# Patient Record
Sex: Male | Born: 1965 | Race: White | Hispanic: No | Marital: Married | Smoking: Never smoker
Health system: Southern US, Community
[De-identification: ages and names within clinical notes are randomized; demographics above are authoritative.]

## PROBLEM LIST (undated history)

## (undated) DIAGNOSIS — J45990 Exercise induced bronchospasm: Secondary | ICD-10-CM

## (undated) DIAGNOSIS — I10 Essential (primary) hypertension: Secondary | ICD-10-CM

## (undated) DIAGNOSIS — K219 Gastro-esophageal reflux disease without esophagitis: Secondary | ICD-10-CM

## (undated) HISTORY — DX: Exercise induced bronchospasm: J45.990

## (undated) HISTORY — DX: Essential (primary) hypertension: I10

## (undated) HISTORY — DX: Gastro-esophageal reflux disease without esophagitis: K21.9

---

## 2000-03-29 HISTORY — PX: VASECTOMY: SHX75

## 2001-01-18 ENCOUNTER — Encounter: Payer: Self-pay | Admitting: *Deleted

## 2001-01-18 ENCOUNTER — Encounter: Admission: RE | Admit: 2001-01-18 | Discharge: 2001-01-18 | Payer: Self-pay | Admitting: *Deleted

## 2008-10-02 ENCOUNTER — Emergency Department (HOSPITAL_COMMUNITY): Admission: EM | Admit: 2008-10-02 | Discharge: 2008-10-02 | Payer: Self-pay | Admitting: Emergency Medicine

## 2008-10-17 ENCOUNTER — Ambulatory Visit (HOSPITAL_COMMUNITY): Admission: RE | Admit: 2008-10-17 | Discharge: 2008-10-17 | Payer: Self-pay | Admitting: Urology

## 2010-02-11 IMAGING — CR DG ABDOMEN 1V
1 series · 1 of 1 positions shown · non-contrast
Comparison: CT scan 10/02/2008

CLINICAL DATA: Pre lithotripsy for left ureteral calculus

ABDOMEN - 1 VIEW

[t abdomen supine]
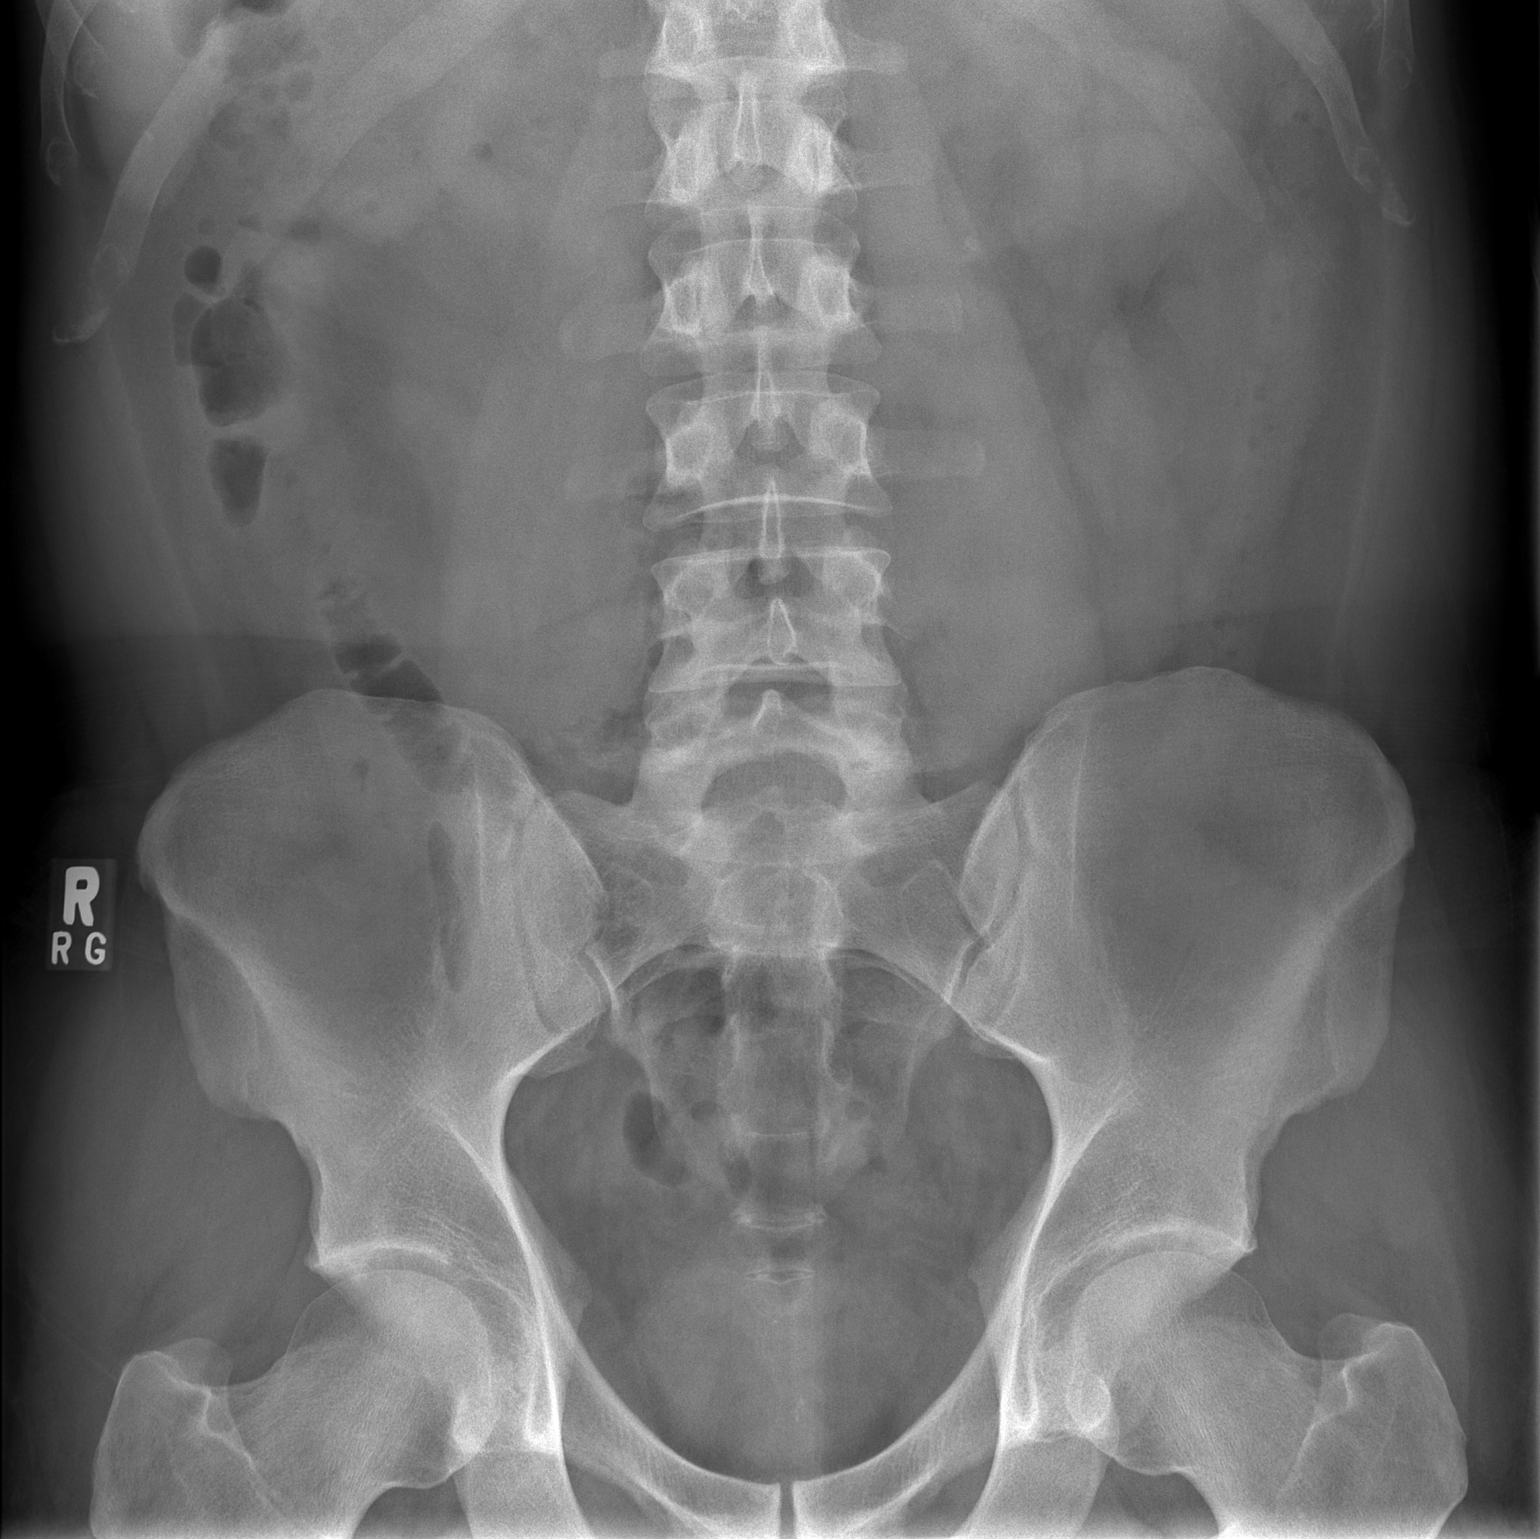

[1 of 1 positions shown; findings below may reference images not displayed]

FINDINGS: There is a 4 mm calcification on the left, between the
first and second transverse process sees.  This correlates with the
recent CT.  No other definite renal or ureteral calculi.
IMPRESSION: 4 mm calculus in the left proximal ureter.

## 2010-07-05 LAB — DIFFERENTIAL
Basophils Absolute: 0 10*3/uL (ref 0.0–0.1)
Eosinophils Absolute: 0.1 10*3/uL (ref 0.0–0.7)
Eosinophils Relative: 1 % (ref 0–5)
Lymphocytes Relative: 18 % (ref 12–46)

## 2010-07-05 LAB — CBC
HCT: 43.3 % (ref 39.0–52.0)
Platelets: 251 10*3/uL (ref 150–400)
RDW: 13.5 % (ref 11.5–15.5)

## 2010-07-05 LAB — URINE MICROSCOPIC-ADD ON

## 2010-07-05 LAB — URINALYSIS, ROUTINE W REFLEX MICROSCOPIC
Glucose, UA: NEGATIVE mg/dL
Leukocytes, UA: NEGATIVE
Nitrite: NEGATIVE
Protein, ur: 30 mg/dL — AB
Specific Gravity, Urine: 1.027 (ref 1.005–1.030)
Urobilinogen, UA: 0.2 mg/dL (ref 0.0–1.0)
pH: 5.5 (ref 5.0–8.0)

## 2010-07-05 LAB — BASIC METABOLIC PANEL
BUN: 18 mg/dL (ref 6–23)
Creatinine, Ser: 1.24 mg/dL (ref 0.4–1.5)
GFR calc non Af Amer: >60 mL/min (ref >60)
Glucose, Bld: 104 mg/dL — ABNORMAL HIGH (ref 70–99)
Potassium: 3.8 mEq/L (ref 3.5–5.1)

## 2018-03-29 HISTORY — PX: COLON RESECTION: SHX5231

## 2021-02-27 ENCOUNTER — Other Ambulatory Visit: Payer: Self-pay

## 2021-02-27 ENCOUNTER — Emergency Department (HOSPITAL_BASED_OUTPATIENT_CLINIC_OR_DEPARTMENT_OTHER): Payer: 59 | Admitting: Radiology

## 2021-02-27 ENCOUNTER — Emergency Department (HOSPITAL_BASED_OUTPATIENT_CLINIC_OR_DEPARTMENT_OTHER)
Admission: EM | Admit: 2021-02-27 | Discharge: 2021-02-27 | Disposition: A | Discharge status: Home / Self Care | Payer: 59 | Attending: Emergency Medicine | Admitting: Emergency Medicine

## 2021-02-27 ENCOUNTER — Encounter (HOSPITAL_BASED_OUTPATIENT_CLINIC_OR_DEPARTMENT_OTHER): Payer: Self-pay | Admitting: Emergency Medicine

## 2021-02-27 DIAGNOSIS — D72829 Elevated white blood cell count, unspecified: Secondary | ICD-10-CM | POA: Insufficient documentation

## 2021-02-27 DIAGNOSIS — R002 Palpitations: Secondary | ICD-10-CM | POA: Diagnosis not present

## 2021-02-27 DIAGNOSIS — R0602 Shortness of breath: Secondary | ICD-10-CM | POA: Diagnosis not present

## 2021-02-27 DIAGNOSIS — Z91010 Allergy to peanuts: Secondary | ICD-10-CM | POA: Insufficient documentation

## 2021-02-27 DIAGNOSIS — R079 Chest pain, unspecified: Secondary | ICD-10-CM | POA: Insufficient documentation

## 2021-02-27 DIAGNOSIS — R000 Tachycardia, unspecified: Secondary | ICD-10-CM | POA: Diagnosis not present

## 2021-02-27 LAB — BASIC METABOLIC PANEL
Anion gap: 10 (ref 5–15)
BUN: 17 mg/dL (ref 6–20)
CO2: 28 mmol/L (ref 22–32)
Calcium: 10 mg/dL (ref 8.9–10.3)
Chloride: 101 mmol/L (ref 98–111)
Creatinine, Ser: 1.13 mg/dL (ref 0.61–1.24)
GFR, Estimated: >60 mL/min (ref >60)
Glucose, Bld: 105 mg/dL — ABNORMAL HIGH (ref 70–99)
Potassium: 3.6 mmol/L (ref 3.5–5.1)
Sodium: 139 mmol/L (ref 135–145)

## 2021-02-27 LAB — CBC
HCT: 44.2 % (ref 39.0–52.0)
Hemoglobin: 15 g/dL (ref 13.0–17.0)
MCH: 29.9 pg (ref 26.0–34.0)
MCHC: 33.9 g/dL (ref 30.0–36.0)
MCV: 88.2 fL (ref 80.0–100.0)
Platelets: 295 10*3/uL (ref 150–400)
RBC: 5.01 MIL/uL (ref 4.22–5.81)
RDW: 12.6 % (ref 11.5–15.5)
WBC: 10.8 10*3/uL — ABNORMAL HIGH (ref 4.0–10.5)
nRBC: 0 % (ref 0.0–0.2)

## 2021-02-27 LAB — HEPATIC FUNCTION PANEL
ALT: 16 U/L (ref 0–44)
AST: 17 U/L (ref 15–41)
Albumin: 4.6 g/dL (ref 3.5–5.0)
Alkaline Phosphatase: 52 U/L (ref 38–126)
Bilirubin, Direct: 0.1 mg/dL (ref 0.0–0.2)
Indirect Bilirubin: 0.4 mg/dL (ref 0.3–0.9)
Total Bilirubin: 0.5 mg/dL (ref 0.3–1.2)
Total Protein: 7.5 g/dL (ref 6.5–8.1)

## 2021-02-27 LAB — D-DIMER, QUANTITATIVE: D-Dimer, Quant: <0.27 ug/mL-FEU (ref 0.00–0.50)

## 2021-02-27 LAB — LIPASE, BLOOD: Lipase: 21 U/L (ref 11–51)

## 2021-02-27 LAB — TROPONIN I (HIGH SENSITIVITY)
Troponin I (High Sensitivity): 3 ng/L (ref <18)
Troponin I (High Sensitivity): 4 ng/L (ref <18)

## 2021-02-27 NOTE — ED Triage Notes (Addendum)
Pt presents to ED POV. Pt c/o CP that began around 0200 this morning. Pt reports that pain began at rest, was nonradiatingk, described as sharp, and pt reports being was pale. Pt reports that pain was only for a brief time. swelling in LE was worse yesterday.

## 2021-02-27 NOTE — ED Provider Notes (Signed)
[facility] EMERGENCY DEPT Provider Note   CSN: [id number] Arrival date & time: 02/27/21  1516     History Chief Complaint  Patient presents with   Chest Pain    Mario Freeman is a 55 y.o. male. Patient presents to the emergency department because he woke up in the middle the night with sharp stabbing pain in the center of his test.  He has denies any radiation.  Pain went away on its own for about 20 minutes.  He went back to sleep and woke up at about 7 6 AM and fell off.  He says that he looked in the mirror and felt that he was going gray and was very pale.  He reports that this went away on its own as well.  Also reports yesterday having lower extremity swelling that was worse than normal.  Not specify whether one leg was more swollen than the other.  This is gotten better today.  He reports a lot of anxiety today and palpitations.  He also states that he had some shortness of breath today as well.   Chest Pain Associated symptoms: palpitations and shortness of breath   Associated symptoms: no abdominal pain, no back pain, no cough, no dizziness, no fever, no headache, no nausea, no vomiting and no weakness       History reviewed. No pertinent past medical history.  There are no problems to display for this patient.   History reviewed. No pertinent surgical history.     History reviewed. No pertinent family history.     Home Medications Prior to Admission medications   Not on File    Allergies    Amlodipine, Erythromycin, Lactose intolerance (gi), Lisinopril, Gluten meal, and Peanut oil  Review of Systems   Review of Systems  Constitutional:  Negative for chills and fever.  HENT:  Negative for congestion, rhinorrhea and sore throat.   Eyes:  Negative for visual disturbance.  Respiratory:  Positive for shortness of breath. Negative for cough and chest tightness.   Cardiovascular:  Positive for chest pain, palpitations and leg swelling.   Gastrointestinal:  Negative for abdominal pain, blood in stool, constipation, diarrhea, nausea and vomiting.  Genitourinary:  Negative for dysuria, flank pain and hematuria.  Musculoskeletal:  Negative for back pain.  Skin:  Negative for rash and wound.  Neurological:  Negative for dizziness, syncope, weakness, light-headedness and headaches.  Psychiatric/Behavioral:  Negative for confusion.   All other systems reviewed and are negative.  Physical Exam Updated Vital Signs BP (!) 151/90 (BP Location: Right Arm)   Pulse 97   Temp 98.3 F (36.8 C) (Oral)   Resp 16   Ht 6\' 1"  (1.854 m)   Wt 103 kg   SpO2 99%   BMI 29.95 kg/m   Physical Exam Vitals and nursing note reviewed.  Constitutional:      General: He is not in acute distress.    Appearance: Normal appearance. He is well-developed. He is not ill-appearing, toxic-appearing or diaphoretic.  HENT:     Head: Normocephalic and atraumatic.     Nose: No nasal deformity.     Mouth/Throat:     Lips: Pink. No lesions.     Mouth: No injury, lacerations, oral lesions or angioedema.     Pharynx: Uvula midline. No pharyngeal swelling or uvula swelling.  Eyes:     General: Gaze aligned appropriately. No scleral icterus.       Right eye: No discharge.  Left eye: No discharge.     Conjunctiva/sclera: Conjunctivae normal.     Right eye: Right conjunctiva is not injected. No exudate or hemorrhage.    Left eye: Left conjunctiva is not injected. No exudate or hemorrhage. Cardiovascular:     Rate and Rhythm: Regular rhythm. Tachycardia present.     Pulses: Normal pulses.          Radial pulses are 2+ on the right side and 2+ on the left side.       Dorsalis pedis pulses are 2+ on the right side and 2+ on the left side.     Heart sounds: Normal heart sounds, S1 normal and S2 normal. Heart sounds not distant. No murmur heard.   No friction rub. No gallop. No S3 or S4 sounds.  Pulmonary:     Effort: Pulmonary effort is normal. No  tachypnea, accessory muscle usage or respiratory distress.     Breath sounds: Normal breath sounds. No stridor. No decreased breath sounds, wheezing, rhonchi or rales.  Chest:     Chest wall: No tenderness.  Abdominal:     General: Abdomen is flat. Bowel sounds are normal. There is no distension.     Palpations: Abdomen is soft. There is no mass or pulsatile mass.     Tenderness: There is no abdominal tenderness. There is no guarding or rebound.  Musculoskeletal:     Right lower leg: No edema.     Left lower leg: No edema.  Skin:    General: Skin is warm and dry.     Coloration: Skin is not jaundiced or pale.     Findings: No bruising, erythema, lesion or rash.  Neurological:     General: No focal deficit present.     Mental Status: He is alert and oriented to person, place, and time.     GCS: GCS eye subscore is 4. GCS verbal subscore is 5. GCS motor subscore is 6.     Sensory: No sensory deficit.     Motor: No weakness.  Psychiatric:        Mood and Affect: Mood is anxious.        Behavior: Behavior normal. Behavior is cooperative.    ED Results / Procedures / Treatments   Labs (all labs ordered are listed, but only abnormal results are displayed) Labs Reviewed  BASIC METABOLIC PANEL - Abnormal; Notable for the following components:      Result Value   Glucose, Bld 105 (*)    All other components within normal limits  CBC - Abnormal; Notable for the following components:   WBC 10.8 (*)    All other components within normal limits  HEPATIC FUNCTION PANEL  LIPASE, BLOOD  D-DIMER, QUANTITATIVE  TROPONIN I (HIGH SENSITIVITY)  TROPONIN I (HIGH SENSITIVITY)    EKG None  Radiology DG Chest 2 View  Result Date: 02/27/2021 CLINICAL DATA:  Chest pain EXAM: CHEST - 2 VIEW COMPARISON:  None. FINDINGS: The heart size and mediastinal contours are within normal limits. Both lungs are clear. The visualized skeletal structures are unremarkable. IMPRESSION: No active cardiopulmonary  disease. Electronically Signed   By: Judie PetitM.  Shick M.D.   On: 02/27/2021 16:11    Procedures Procedures   Medications Ordered in ED Medications - No data to display  ED Course  I have reviewed the triage vital signs and the nursing notes.  Pertinent labs & imaging results that were available during my care of the patient were reviewed by me and considered  in my medical decision making (see chart for details).    MDM Rules/Calculators/A&P                         This is a well-appearing 55 year old male who presented with an episode of chest pain last night along with some feeling off and anxious feelings today. His vitals are stable.  He is afebrile. Work-up is normal with negative troponins.  He has a trace leukocytosis of 10.8.  BMP, lipase, hepatic function panel, dimer are all normal.  This is a patient with no cardiac history and story is not consistent with cardiac etiology of symptoms.  Asymptomatic at the time that I am seeing him.  I doubt that this was cardiac etiology.  Likely GI etiology given history of GERD.  He has an upcoming cardiology appointment in January.  He can keep this appointment to follow-up with them.  Reassurance provided.   Final Clinical Impression(s) / ED Diagnoses Final diagnoses:  Chest pain, unspecified type    Rx / DC Orders ED Discharge Orders     None        Adolphus Birchwood, PA-C 02/27/21 2224    Regan Lemming, MD 02/28/21 1932

## 2021-02-27 NOTE — Discharge Instructions (Signed)
Please follow up with your PCP. Please return to the ED if you develop worsening or more concerning symptoms.

## 2021-02-27 NOTE — ED Notes (Signed)
Patient states woke up with sharp stabbing in center of chest.  States woke with anxiety and then pain when away within 20 minutes.  Denies SOB or nausea.   No pain at present but states feels anxious all day.

## 2022-06-24 IMAGING — DX DG CHEST 2V
2 series · 2 of 2 positions shown · non-contrast
Comparison: None.

CLINICAL DATA: Chest pain

EXAM:
CHEST - 2 VIEW

[chest pa]
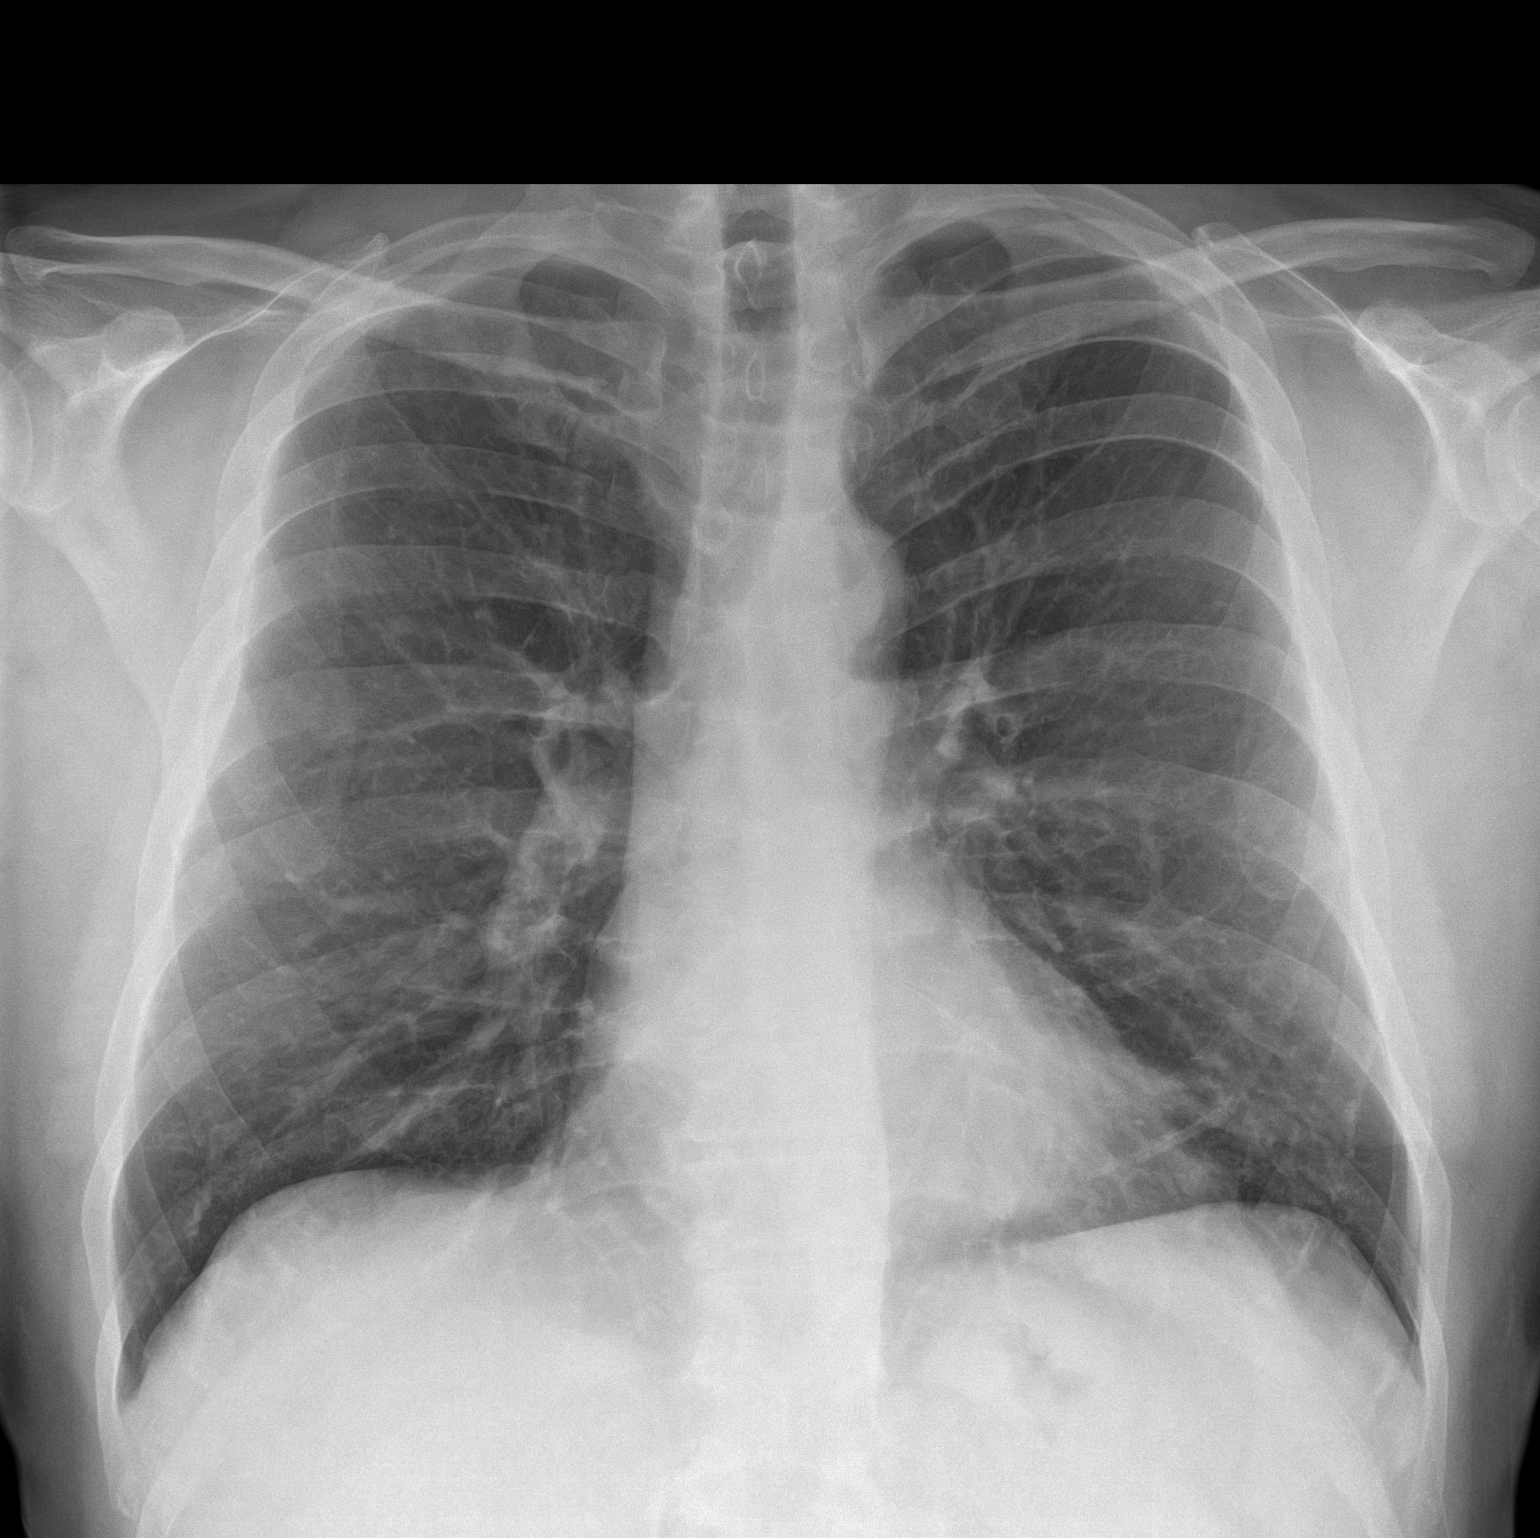

[chest lat]
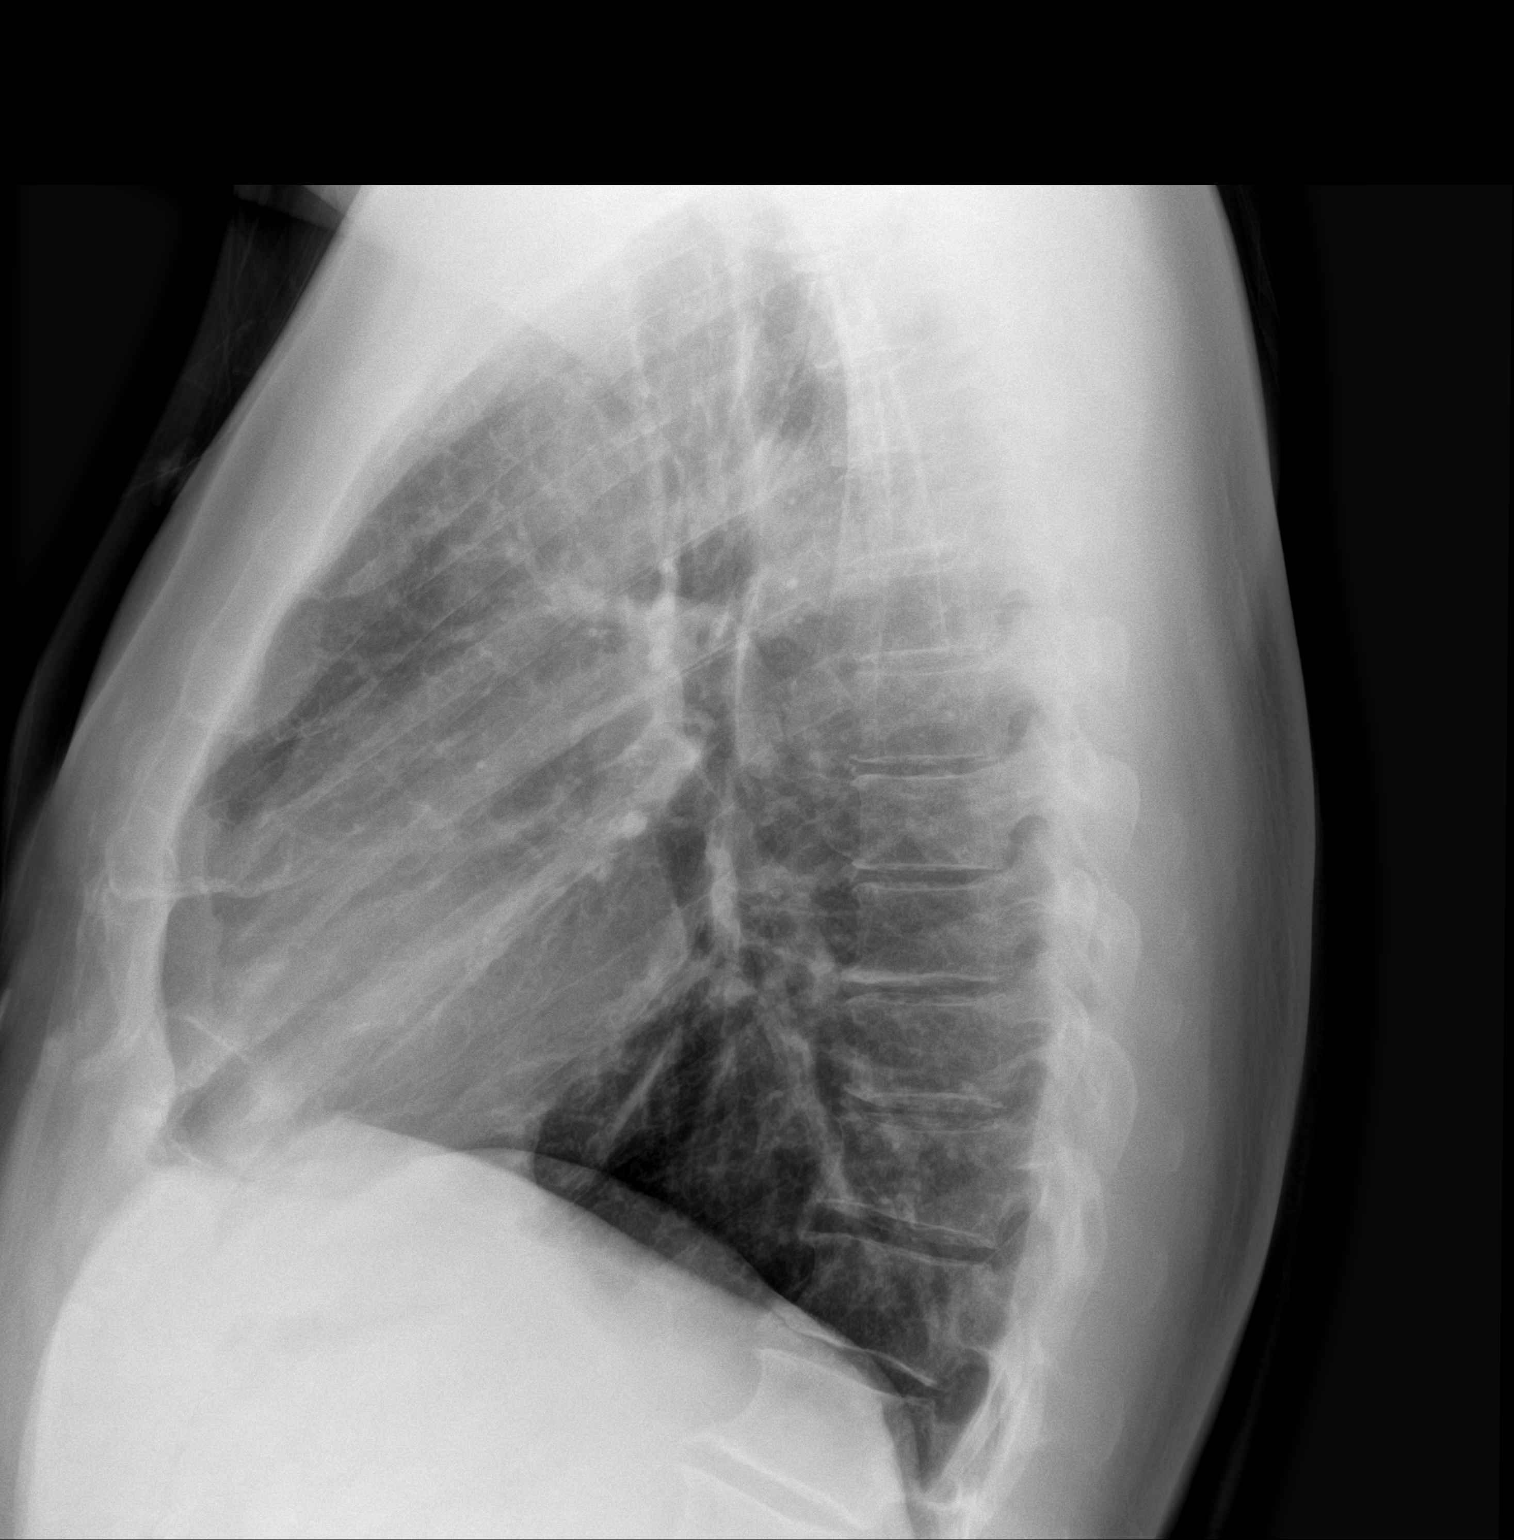

[2 of 2 positions shown; findings below may reference images not displayed]

FINDINGS: The heart size and mediastinal contours are within normal limits.
Both lungs are clear. The visualized skeletal structures are
unremarkable.
IMPRESSION: No active cardiopulmonary disease.

## 2022-11-18 ENCOUNTER — Ambulatory Visit (INDEPENDENT_AMBULATORY_CARE_PROVIDER_SITE_OTHER): Payer: 59 | Admitting: Family Medicine

## 2022-11-18 ENCOUNTER — Encounter: Payer: Self-pay | Admitting: Family Medicine

## 2022-11-18 VITALS — BP 132/98 | HR 103 | Temp 98.7°F | Resp 18 | Ht 73.0 in | Wt 247.0 lb

## 2022-11-18 DIAGNOSIS — K219 Gastro-esophageal reflux disease without esophagitis: Secondary | ICD-10-CM | POA: Diagnosis not present

## 2022-11-18 DIAGNOSIS — J45990 Exercise induced bronchospasm: Secondary | ICD-10-CM | POA: Diagnosis not present

## 2022-11-18 DIAGNOSIS — I10 Essential (primary) hypertension: Secondary | ICD-10-CM | POA: Diagnosis not present

## 2022-11-18 MED ORDER — ALBUTEROL SULFATE HFA 108 (90 BASE) MCG/ACT IN AERS
2.0000 | INHALATION_SPRAY | Freq: Four times a day (QID) | RESPIRATORY_TRACT | 5 refills | Status: AC | PRN
Start: 2022-11-18 — End: ?

## 2022-11-18 MED ORDER — PANTOPRAZOLE SODIUM 40 MG PO TBEC
40.0000 mg | DELAYED_RELEASE_TABLET | Freq: Every day | ORAL | 1 refills | Status: DC
Start: 2022-11-18 — End: 2023-05-13

## 2022-11-18 MED ORDER — LOSARTAN POTASSIUM-HCTZ 100-25 MG PO TABS: 1.0000 | ORAL_TABLET | Freq: Every day | ORAL | 1 refills | Status: DC

## 2022-11-18 NOTE — Patient Instructions (Signed)

## 2022-11-18 NOTE — Progress Notes (Signed)
Established Patient Office Visit  Subjective   Patient ID: Mario Freeman, male    DOB: Apr 06, 1965  Age: 57 y.o. MRN: [id number]  Chief Complaint  Patient presents with   New Patient (Initial Visit)    Pt states here to establish care and refills on blood pressure     HPI Discussed the use of AI scribe software for clinical note transcription with the patient, who gave verbal consent to proceed.  History of Present Illness   The patient, with a history of high blood pressure and reflux, presents for establishing care. He reports running out of their medication, Losartan HCTZ, and Pantoprazole, and have been managing by splitting the remaining pills and supplementing with leftover HCTZ. He has not been using Pantoprazole regularly, instead opting for Pepcid AC as needed.  In addition to these conditions, the patient underwent a colon resection in 2020 due to a large polyp in the large intestine. The recovery was complicated by significant pain and suspected anal sphincter damage, leading to changes in bowel movements. 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  The patient has a strong belief in managing health conditions through lifestyle changes and natural remedies before resorting to medication. He is currently working on losing weight and believe that this will help manage their blood pressure. He also express concerns about their living environment potentially exacerbating their asthma and contributing to their high blood pressure.      Patient Active Problem List   Diagnosis Date Noted   Primary hypertension 11/18/2022   Gastroesophageal reflux disease 11/18/2022   Past Medical History:  Diagnosis Date   Exercise-induced asthma    GERD (gastroesophageal reflux disease)    HTN (hypertension)    Past Surgical History:  Procedure Laterality Date   COLON RESECTION  2020   18 in   VASECTOMY  2002   Social  History   Tobacco Use   Smoking status: Never   Smokeless tobacco: Never  Substance Use Topics   Alcohol use: Yes    Comment: 1-2 x a month   Drug use: Never   Social History   Socioeconomic History   Marital status: Married    Spouse name: Not on file   Number of children: 3   Years of education: Not on file   Highest education level: Not on file  Occupational History   Occupation: [employer]    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  Tobacco Use   Smoking status: Never   Smokeless tobacco: Never  Substance and Sexual Activity   Alcohol use: Yes    Comment: 1-2 x a month   Drug use: Never   Sexual activity: Yes    Partners: Female  Other Topics Concern   Not on file  Social History Narrative   Not on file   Social Determinants of Health   Financial Resource Strain: Not on file  Food Insecurity: Not on file  Transportation Needs: Not on file  Physical Activity: Not on file  Stress: Not on file  Social Connections: Unknown (08/09/2021)   Received from [facility]   Social Network    Social Network: Not on file  Intimate Partner Violence: Unknown (07/01/2021)   Received from [facility]   HITS    Physically Hurt: Not on file    Insult or Talk Down To: Not on file    Threaten Physical Harm: Not on file    Scream or Curse:  Not on file   Family Status  Relation Name Status   Mother  Alive   Father  Deceased   MGM  Deceased   MGF  Deceased   PGM  Deceased   PGF  Deceased  No partnership data on file   Family History  Problem Relation Age of Onset   Hypertension Mother    Hyperlipidemia Father    Hypertension Father    Prostate cancer Father    Heart attack Father    Diabetes Mellitus I Father    Colon cancer Maternal Grandmother    Allergies  Allergen Reactions   Amlodipine Other (See Comments)   Erythromycin Other (See Comments)    Reaction as kid   Lactose Intolerance (Gi) Other (See Comments)    Abdominal pain   Lisinopril Rash   Gluten Meal  Nausea Only   Peanut Oil Other (See Comments)      Review of Systems  Constitutional:  Negative for fever and malaise/fatigue.  HENT:  Negative for congestion.   Eyes:  Negative for blurred vision.  Respiratory:  Negative for cough and shortness of breath.   Cardiovascular:  Negative for chest pain, palpitations and leg swelling.  Gastrointestinal:  Negative for abdominal pain, blood in stool, nausea and vomiting.  Genitourinary:  Negative for dysuria and frequency.  Musculoskeletal:  Negative for back pain and falls.  Skin:  Negative for rash.  Neurological:  Negative for dizziness, loss of consciousness and headaches.  Endo/Heme/Allergies:  Negative for environmental allergies.  Psychiatric/Behavioral:  Negative for depression. The patient is not nervous/anxious.       Objective:     BP (!) 132/98 (BP Location: Right Arm, Patient Position: Sitting, Cuff Size: Large)   Pulse (!) 103   Temp 98.7 F (37.1 C) (Oral)   Resp 18   Ht 6\' 1"  (1.854 m)   Wt 247 lb (112 kg)   SpO2 97%   BMI 32.59 kg/m  BP Readings from Last 3 Encounters:  11/18/22 (!) 132/98  02/27/21 (!) 151/90   Wt Readings from Last 3 Encounters:  11/18/22 247 lb (112 kg)  02/27/21 227 lb (103 kg)   SpO2 Readings from Last 3 Encounters:  11/18/22 97%  02/27/21 99%      Physical Exam Vitals and nursing note reviewed.  Constitutional:      General: He is not in acute distress.    Appearance: Normal appearance. He is well-developed.  HENT:     Head: Normocephalic and atraumatic.  Eyes:     General: No scleral icterus.       Right eye: No discharge.        Left eye: No discharge.  Cardiovascular:     Rate and Rhythm: Normal rate and regular rhythm.     Heart sounds: No murmur heard. Pulmonary:     Effort: Pulmonary effort is normal. No respiratory distress.     Breath sounds: Normal breath sounds.  Musculoskeletal:        General: Normal range of motion.     Cervical back: Normal range of  motion and neck supple.     Right lower leg: No edema.     Left lower leg: No edema.  Skin:    General: Skin is warm and dry.  Neurological:     Mental Status: He is alert and oriented to person, place, and time.  Psychiatric:        Mood and Affect: Mood normal.  Behavior: Behavior normal.        Thought Content: Thought content normal.        Judgment: Judgment normal.      No results found for any visits on 11/18/22.  Last CBC Lab Results  Component Value Date   WBC 10.8 (H) 02/27/2021   HGB 15.0 02/27/2021   HCT 44.2 02/27/2021   MCV 88.2 02/27/2021   MCH 29.9 02/27/2021   RDW 12.6 02/27/2021   PLT 295 02/27/2021   Last metabolic panel Lab Results  Component Value Date   GLUCOSE 105 (H) 02/27/2021   NA 139 02/27/2021   K 3.6 02/27/2021   CL 101 02/27/2021   CO2 28 02/27/2021   BUN 17 02/27/2021   CREATININE 1.13 02/27/2021   GFRNONAA >60 02/27/2021   CALCIUM 10.0 02/27/2021   PROT 7.5 02/27/2021   ALBUMIN 4.6 02/27/2021   BILITOT 0.5 02/27/2021   ALKPHOS 52 02/27/2021   AST 17 02/27/2021   ALT 16 02/27/2021   ANIONGAP 10 02/27/2021   Last lipids No results found for: "CHOL", "HDL", "LDLCALC", "LDLDIRECT", "TRIG", "CHOLHDL" Last hemoglobin A1c No results found for: "HGBA1C" Last thyroid functions No results found for: "TSH", "T3TOTAL", "T4TOTAL", "THYROIDAB" Last vitamin D No results found for: "25OHVITD2", "25OHVITD3", "VD25OH" Last vitamin B12 and Folate No results found for: "VITAMINB12", "FOLATE"    The ASCVD Risk score (Arnett DK, et al., 2019) failed to calculate for the following reasons:   Cannot find a previous HDL lab   Cannot find a previous total cholesterol lab    Assessment & Plan:   Problem List Items Addressed This Visit       Unprioritized   Primary hypertension - Primary   Relevant Medications   losartan-hydrochlorothiazide (HYZAAR) 100-25 MG tablet   Other Relevant Orders   CBC with Differential/Platelet    Comprehensive metabolic panel   Lipid panel   TSH   Gastroesophageal reflux disease   Relevant Medications   pantoprazole (PROTONIX) 40 MG tablet   Other Visit Diagnoses     Exercise-induced asthma       Relevant Medications   albuterol (VENTOLIN HFA) 108 (90 Base) MCG/ACT inhaler     Assessment and Plan    Hypertension Patient has been rationing Losartan-HCTZ due to running low on medication. No acute symptoms reported. -Resume regular dosing of Losartan-HCTZ. -Send prescription refill to [facility] on [address].  Gastroesophageal Reflux Disease (GERD) Patient has been using over-the-counter Pepcid AC as needed, but prefers Pantoprazole for symptom control. -Resume Pantoprazole as needed for reflux symptoms. -Send prescription refill to [facility] on [address].  History of Colon Resection Patient underwent colon resection in 2020 due to a large carpet polyp and severe diverticulitis. No current symptoms reported. -Plan for follow-up colonoscopy, to be scheduled at a later date.  Anal Sphincter Injury Patient reports change in bowel movements and history of pain post-colon resection, suspecting anal sphincter injury. Symptoms have improved over time. -No immediate intervention planned. Monitor symptoms.  Asthma Patient reports history of sports-induced asthma and occasional use of Albuterol inhaler. Recent exacerbation with COVID-19 infection. -Continue Albuterol as needed for asthma symptoms.  General Health Maintenance -Plan for fasting labs prior to next visit in January. -Consider tetanus shot if not updated in the last 5 years. -Discuss flu shot at next visit.        Return in about 6 months (around 05/21/2023), or if symptoms worsen or fail to improve, for annual exam, fasting.    Lelon Perla  Chase, DO

## 2023-04-21 ENCOUNTER — Encounter: Payer: Self-pay | Admitting: Family Medicine

## 2023-04-21 ENCOUNTER — Ambulatory Visit (INDEPENDENT_AMBULATORY_CARE_PROVIDER_SITE_OTHER): Payer: 59 | Admitting: Family Medicine

## 2023-04-21 VITALS — BP 136/88 | HR 106 | Temp 98.5°F | Resp 18 | Ht 73.0 in | Wt 248.4 lb

## 2023-04-21 DIAGNOSIS — Z8249 Family history of ischemic heart disease and other diseases of the circulatory system: Secondary | ICD-10-CM

## 2023-04-21 DIAGNOSIS — Z860100 Personal history of colon polyps, unspecified: Secondary | ICD-10-CM | POA: Diagnosis not present

## 2023-04-21 DIAGNOSIS — Z0000 Encounter for general adult medical examination without abnormal findings: Secondary | ICD-10-CM | POA: Insufficient documentation

## 2023-04-21 DIAGNOSIS — Z1159 Encounter for screening for other viral diseases: Secondary | ICD-10-CM | POA: Diagnosis not present

## 2023-04-21 DIAGNOSIS — I10 Essential (primary) hypertension: Secondary | ICD-10-CM | POA: Diagnosis not present

## 2023-04-21 LAB — COMPREHENSIVE METABOLIC PANEL
ALT: 26 U/L (ref 0–53)
AST: 22 U/L (ref 0–37)
Albumin: 4.4 g/dL (ref 3.5–5.2)
Alkaline Phosphatase: 53 U/L (ref 39–117)
BUN: 17 mg/dL (ref 6–23)
CO2: 33 meq/L — ABNORMAL HIGH (ref 19–32)
Calcium: 9.5 mg/dL (ref 8.4–10.5)
Chloride: 100 meq/L (ref 96–112)
Creatinine, Ser: 0.97 mg/dL (ref 0.40–1.50)
GFR: 87.48 mL/min (ref >60.00)
Glucose, Bld: 99 mg/dL (ref 70–99)
Potassium: 4.1 meq/L (ref 3.5–5.1)
Sodium: 140 meq/L (ref 135–145)
Total Bilirubin: 0.6 mg/dL (ref 0.2–1.2)
Total Protein: 7.2 g/dL (ref 6.0–8.3)

## 2023-04-21 LAB — CBC WITH DIFFERENTIAL/PLATELET
Basophils Absolute: 0.1 10*3/uL (ref 0.0–0.1)
Basophils Relative: 0.8 % (ref 0.0–3.0)
Eosinophils Absolute: 0.2 10*3/uL (ref 0.0–0.7)
Eosinophils Relative: 3.2 % (ref 0.0–5.0)
HCT: 45.7 % (ref 39.0–52.0)
Hemoglobin: 15.3 g/dL (ref 13.0–17.0)
Lymphocytes Relative: 28.6 % (ref 12.0–46.0)
Lymphs Abs: 2 10*3/uL (ref 0.7–4.0)
MCHC: 33.5 g/dL (ref 30.0–36.0)
MCV: 91 fL (ref 78.0–100.0)
Monocytes Absolute: 0.6 10*3/uL (ref 0.1–1.0)
Monocytes Relative: 8.7 % (ref 3.0–12.0)
Neutro Abs: 4 10*3/uL (ref 1.4–7.7)
Neutrophils Relative %: 58.7 % (ref 43.0–77.0)
Platelets: 313 10*3/uL (ref 150.0–400.0)
RBC: 5.03 Mil/uL (ref 4.22–5.81)
RDW: 13.1 % (ref 11.5–15.5)
WBC: 6.8 10*3/uL (ref 4.0–10.5)

## 2023-04-21 LAB — LIPID PANEL
Cholesterol: 193 mg/dL (ref 0–200)
HDL: 38.7 mg/dL — ABNORMAL LOW (ref >39.00)
LDL Cholesterol: 116 mg/dL — ABNORMAL HIGH (ref 0–99)
NonHDL: 153.92
Total CHOL/HDL Ratio: 5
Triglycerides: 189 mg/dL — ABNORMAL HIGH (ref 0.0–149.0)
VLDL: 37.8 mg/dL (ref 0.0–40.0)

## 2023-04-21 LAB — TSH: TSH: 3.82 u[IU]/mL (ref 0.35–5.50)

## 2023-04-21 LAB — PSA: PSA: 3.74 ng/mL (ref 0.10–4.00)

## 2023-04-21 MED ORDER — LOSARTAN POTASSIUM-HCTZ 100-25 MG PO TABS: 1.0000 | ORAL_TABLET | Freq: Every day | ORAL | 3 refills | Status: AC

## 2023-04-21 NOTE — Progress Notes (Signed)
Established Patient Office Visit  Subjective   Patient ID: Mario Freeman, male    DOB: September 05, 1965  Age: 58 y.o. MRN: [id number]  Chief Complaint  Patient presents with   Annual Exam    Pt states fasting     HPI Discussed the use of AI scribe software for clinical note transcription with the patient, who gave verbal consent to proceed.  History of Present Illness   The patient, a 58 year old individual with a history of hypertension managed with losartan HCTZ, presented for a routine check-up. He reported no new symptoms or changes in his health status. The patient mentioned a previous prescription for pantoprazole, which he uses intermittently for occasional postprandial discomfort, approximately twice a week. He also reported using an inhaler, but only during exercise, which he does not engage in regularly.  The patient has a history of colon resection in 2020 due to a carpet polyp, which was not cancerous but was deemed likely to become so if left untreated. Post-surgery recovery was challenging, with significant pain and dietary changes. The patient expressed concern about the need for follow-up colonoscopies, given his previous experience and the potential for discomfort.  The patient also reported a family history of heart disease, with his father having had two heart attacks in his early fifties. He expressed concern about his own risk, given this history, but also noted that he maintains a healthier lifestyle than his father did. The patient is currently overweight but has plans to start exercising regularly and resume a Weight [employer] program.  The patient also mentioned occasional edema in his legs, which he attributes to dietary factors, particularly the consumption of salty foods or gluten. He reported no other significant symptoms or changes in his health status.      Patient Active Problem List   Diagnosis Date Noted   Preventative health care 04/21/2023   Primary  hypertension 11/18/2022   Gastroesophageal reflux disease 11/18/2022   Past Medical History:  Diagnosis Date   Exercise-induced asthma    GERD (gastroesophageal reflux disease)    HTN (hypertension)    Past Surgical History:  Procedure Laterality Date   COLON RESECTION  2020   18 in   VASECTOMY  2002   Social History   Tobacco Use   Smoking status: Never   Smokeless tobacco: Never  Substance Use Topics   Alcohol use: Yes    Comment: 1-2 x a month   Drug use: Never   Social History   Socioeconomic History   Marital status: Married    Spouse name: Not on file   Number of children: 3   Years of education: Not on file   Highest education level: Not on file  Occupational History   Occupation: [employer]    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   Social Network  Social Network: Not on file  Intimate Partner Violence: Unknown (07/01/2021)   Received from [facility]   HITS    Physically Hurt: Not on file    Insult or Talk Down To: Not on file    Threaten Physical Harm: Not on file    Scream or Curse: Not on file   Family Status  Relation Name Status   Mother  Alive   Father  Deceased   MGM  Deceased   MGF  Deceased   PGM  Deceased   PGF  Deceased  No partnership data on file   Family History  Problem Relation Age of Onset   Hypertension Mother    Hyperlipidemia Father    Hypertension Father    Prostate  cancer Father    Heart attack Father    Diabetes Mellitus I Father    Colon cancer Maternal Grandmother    Allergies  Allergen Reactions   Amlodipine Other (See Comments)   Erythromycin Other (See Comments)    Reaction as kid   Lactose Intolerance (Gi) Other (See Comments)    Abdominal pain   Lisinopril Rash   Gluten Meal Nausea Only   Peanut Oil Other (See Comments)      Review of Systems  Constitutional:  Negative for fever.  HENT:  Negative for congestion.   Eyes:  Negative for blurred vision.  Respiratory:  Negative for cough.   Cardiovascular:  Negative for chest pain and palpitations.  Gastrointestinal:  Negative for vomiting.  Musculoskeletal:  Negative for back pain.  Skin:  Negative for rash.  Neurological:  Negative for loss of consciousness and headaches.      Objective:     BP 136/88 (BP Location: Left Arm, Patient Position: Sitting, Cuff Size: Large)   Pulse (!) 106   Temp 98.5 F (36.9 C) (Oral)   Resp 18   Ht 6\' 1"  (1.854 m)   Wt 248 lb 6.4 oz (112.7 kg)   SpO2 98%   BMI 32.77 kg/m  BP Readings from Last 3 Encounters:  04/21/23 136/88  11/18/22 (!) 132/98  02/27/21 (!) 151/90   Wt Readings from Last 3 Encounters:  04/21/23 248 lb 6.4 oz (112.7 kg)  11/18/22 247 lb (112 kg)  02/27/21 227 lb (103 kg)   SpO2 Readings from Last 3 Encounters:  04/21/23 98%  11/18/22 97%  02/27/21 99%      Physical Exam Vitals and nursing note reviewed.  Constitutional:      General: He is not in acute distress.    Appearance: Normal appearance. He is well-developed.  HENT:     Head: Normocephalic and atraumatic.     Right Ear: Tympanic membrane, ear canal and external ear normal. There is no impacted cerumen.     Left Ear: Tympanic membrane, ear canal and external ear normal. There is no impacted cerumen.     Nose: Nose normal.     Mouth/Throat:     Mouth: Mucous membranes are moist.     Pharynx: Oropharynx is clear. No oropharyngeal exudate or  posterior oropharyngeal erythema.  Eyes:     General: No scleral icterus.       Right eye: No discharge.        Left eye: No discharge.     Conjunctiva/sclera: Conjunctivae normal.     Pupils: Pupils are equal, round, and reactive to light.  Neck:     Thyroid: No thyromegaly.     Vascular: No JVD.  Cardiovascular:  Rate and Rhythm: Normal rate and regular rhythm.     Heart sounds: Normal heart sounds. No murmur heard. Pulmonary:     Effort: Pulmonary effort is normal. No respiratory distress.     Breath sounds: Normal breath sounds.  Abdominal:     General: Bowel sounds are normal. There is no distension.     Palpations: Abdomen is soft. There is no mass.     Tenderness: There is no abdominal tenderness. There is no guarding or rebound.  Musculoskeletal:        General: Normal range of motion.     Cervical back: Normal range of motion and neck supple.     Right lower leg: No edema.     Left lower leg: No edema.  Lymphadenopathy:     Cervical: No cervical adenopathy.  Skin:    General: Skin is warm and dry.     Findings: No erythema or rash.  Neurological:     Mental Status: He is alert and oriented to person, place, and time.     Cranial Nerves: No cranial nerve deficit.     Motor: No abnormal muscle tone.     Deep Tendon Reflexes: Reflexes are normal and symmetric. Reflexes normal.  Psychiatric:        Mood and Affect: Mood normal.        Behavior: Behavior normal.        Thought Content: Thought content normal.        Judgment: Judgment normal.      No results found for any visits on 04/21/23.  Last CBC Lab Results  Component Value Date   WBC 10.8 (H) 02/27/2021   HGB 15.0 02/27/2021   HCT 44.2 02/27/2021   MCV 88.2 02/27/2021   MCH 29.9 02/27/2021   RDW 12.6 02/27/2021   PLT 295 02/27/2021   Last metabolic panel Lab Results  Component Value Date   GLUCOSE 105 (H) 02/27/2021   NA 139 02/27/2021   K 3.6 02/27/2021   CL 101 02/27/2021   CO2 28  02/27/2021   BUN 17 02/27/2021   CREATININE 1.13 02/27/2021   GFRNONAA >60 02/27/2021   CALCIUM 10.0 02/27/2021   PROT 7.5 02/27/2021   ALBUMIN 4.6 02/27/2021   BILITOT 0.5 02/27/2021   ALKPHOS 52 02/27/2021   AST 17 02/27/2021   ALT 16 02/27/2021   ANIONGAP 10 02/27/2021   Last lipids No results found for: "CHOL", "HDL", "LDLCALC", "LDLDIRECT", "TRIG", "CHOLHDL" Last hemoglobin A1c No results found for: "HGBA1C" Last thyroid functions No results found for: "TSH", "T3TOTAL", "T4TOTAL", "THYROIDAB" Last vitamin D No results found for: "25OHVITD2", "25OHVITD3", "VD25OH" Last vitamin B12 and Folate No results found for: "VITAMINB12", "FOLATE"    The ASCVD Risk score (Arnett DK, et al., 2019) failed to calculate for the following reasons:   Cannot find a previous HDL lab   Cannot find a previous total cholesterol lab    Assessment & Plan:   Problem List Items Addressed This Visit       Unprioritized   Primary hypertension   Relevant Medications   losartan-hydrochlorothiazide (HYZAAR) 100-25 MG tablet   Other Relevant Orders   Lipid panel   TSH   Comprehensive metabolic panel   CBC with Differential/Platelet   Preventative health care - Primary   Ghm utd Check labs  See AVS Health Maintenance  Topic Date Due   Pneumococcal Vaccine 60-2 Years old (1 of 2 - PCV) Never done   HIV Screening  Never done  Hepatitis C Screening  Never done   Zoster Vaccines- Shingrix (1 of 2) Never done   COVID-19 Vaccine (1 - 2024-25 season) Never done   INFLUENZA VACCINE  06/27/2023 (Originally 10/28/2022)   DTaP/Tdap/Td (3 - Td or Tdap) 09/16/2024   Colonoscopy  03/06/2028   HPV VACCINES  Aged Out         Relevant Orders   Lipid panel   PSA   TSH   Comprehensive metabolic panel   CBC with Differential/Platelet   Other Visit Diagnoses       History of colon polyps       Relevant Orders   Ambulatory referral to Gastroenterology     Need for hepatitis C screening test        Relevant Orders   Hepatitis C antibody     Family history of early CAD       Relevant Orders   Lipoprotein A (LPA)     Assessment and Plan    Hypertension Hypertension is managed with losartan HCTZ. Blood pressure control is crucial due to a family history of heart disease. Emphasized the importance of monitoring cholesterol, blood pressure, and engaging in regular exercise. He is considering lifestyle changes to improve overall health. Refill losartan HCTZ prescription. Perform blood work, including an LP(a) test. Monitor blood pressure and cholesterol levels. Encourage regular exercise and a healthy diet. Consider an EKG annually or biennially. Discuss potential referral to a cardiologist if needed.  Gastroesophageal Reflux Disease (GERD) Intermittent GERD symptoms are managed with pantoprazole taken as needed, approximately twice a week. Advised to inform the pharmacy to stop automatic refills of pantoprazole.  Asthma Exercise-induced asthma is managed with an inhaler. No recent exacerbations. Symptoms have improved after moving to a new house. Continue current inhaler use as needed.  Post-Colon Resection Follow-Up Following a colon resection in 2020 due to a carpet polyp, there are no current digestive issues, but a follow-up colonoscopy has not been done. Discussed the need for a follow-up colonoscopy and obtaining the previous colonoscopy report. Refer to a gastroenterologist for a follow-up colonoscopy and obtain the previous colonoscopy report from [facility].  General Health Maintenance Has not received shingles or pneumonia vaccines. Discussed benefits and potential side effects. The shingles vaccine is 97% effective and not live, reducing the risk of shingles and associated complications. The pneumonia vaccine is recommended at age 57. He prefers to research before deciding. Provide information on shingles and pneumonia vaccines for review.  Follow-up Schedule  a follow-up appointment as needed. Return forms for medical records retrieval.        Return in about 6 months (around 10/19/2023), or if symptoms worsen or fail to improve.    Donato Schultz, DO

## 2023-04-21 NOTE — Patient Instructions (Signed)

## 2023-04-21 NOTE — Assessment & Plan Note (Signed)
Ghm utd Check labs  See AVS Health Maintenance  Topic Date Due   Pneumococcal Vaccine 44-57 Years old (1 of 2 - PCV) Never done   HIV Screening  Never done   Hepatitis C Screening  Never done   Zoster Vaccines- Shingrix (1 of 2) Never done   COVID-19 Vaccine (1 - 2024-25 season) Never done   INFLUENZA VACCINE  06/27/2023 (Originally 10/28/2022)   DTaP/Tdap/Td (3 - Td or Tdap) 09/16/2024   Colonoscopy  03/06/2028   HPV VACCINES  Aged Out

## 2023-04-23 ENCOUNTER — Encounter: Payer: Self-pay | Admitting: Family Medicine

## 2023-04-26 LAB — HEPATITIS C ANTIBODY: Hepatitis C Ab: NONREACTIVE

## 2023-04-26 LAB — LIPOPROTEIN A (LPA): Lipoprotein (a): <10 nmol/L (ref <75)

## 2023-05-10 ENCOUNTER — Encounter: Payer: Self-pay | Admitting: Family Medicine

## 2023-05-13 ENCOUNTER — Other Ambulatory Visit: Payer: Self-pay | Admitting: Family Medicine

## 2023-05-13 DIAGNOSIS — K219 Gastro-esophageal reflux disease without esophagitis: Secondary | ICD-10-CM

## 2023-11-07 ENCOUNTER — Other Ambulatory Visit: Payer: Self-pay | Admitting: Family Medicine

## 2023-11-07 DIAGNOSIS — K219 Gastro-esophageal reflux disease without esophagitis: Secondary | ICD-10-CM
